# Patient Record
Sex: Male | Born: 1995 | Race: White | Hispanic: No | Marital: Single | Smoking: Never smoker
Health system: Southern US, Community
[De-identification: ages and names within clinical notes are randomized; demographics above are authoritative.]

---
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
## 2014-05-02 NOTE — Op Note (Signed)
PATIENT NAME:  Peter Vasquez, Alfred MR#:  [id number] DATE OF BIRTH:  Mar 02, 1995  DATE OF PROCEDURE:  06/05/2011  PREOPERATIVE DIAGNOSIS: Acute appendicitis.   POSTOPERATIVE DIAGNOSIS: Acute appendicitis.   OPERATIVE PROCEDURE: Laparoscopic appendectomy.  SURGEON: Donnalee CurryJeffrey Kateline Kinkade, MD  ANESTHESIA: General endotracheal under Dr. Henrene HawkingKephart.   ESTIMATED BLOOD LOSS: 25 mL.   CLINICAL NOTE: This 19 year old male had been well until noon yesterday when he noted diffuse abdominal pain. This was associated with three episodes of vomiting. [place] [facility] [place] and was identified with appendicitis based on CT. His family had been cared for at this institution in the past and he presented here for assessment. He was admitted by Dr. Michela PitcherEly.  DESCRIPTION OF PROCEDURE: With the patient under adequate general endotracheal anesthesia, the abdomen was prepped with ChloraPrep and draped. He had previously received Invanz for antibiotic prophylaxis. The abdomen was clipped of a small amount of hair in the periumbilical area. The skin was then prepped with ChloraPrep. In Trendelenburg position, a Veress needle was placed through a transumbilical incision. The abdomen was insufflated with CO2 at 10 followed by 12 mmHg pressure, after assuring intra-abdominal location with the hanging drop test. After dilating the port with a 10 mm step port, the catheter was found to be below a very wave-like layer of omentum. The port was repositioned and inspection showed no active bleeding from the omentum. A 10 mm step port was placed in the hypogastrium and a 12 mm Xcel port placed in the left lower quadrant. The appendix was noted in the right lower quadrant and was markedly thickened in its distal three-quarter length. No evidence of perforation or purulent drainage. The base of the appendix was cleared and divided with a blue Endo GIA cartridge and a vascular cartridge was placed across the mesentery. The appendix was  placed in an Endo Catch bag which was then delivered through the 12 mm port site. There was noted to be small spots of bleeding, both along the appendiceal stump and the mesoappendix. These were controlled with electrocautery. The abdomen was then irrigated with lactated Ringer's solution. There seemed to be a little more blood evident from the right lower quadrant and there was a small area along the omentum that showed some bleeding. This was treated also with electrocautery. The visualized bowel showed no evidence of injury. The abdomen was reinspected and again good hemostasis noted.   An 0 Vicryl suture was used to close the 12 mm port site, as well as the umbilical port site.   Skin incisions were closed with 4-0 Vicryl subcuticular suture. Benzoin, Steri-Strips, Telfa, and Tegaderm dressing was then applied.   The patient tolerated the procedure well and was taken to the recovery room in stable condition.  ____________________________ Earline MayotteJeffrey W. Ciani Rutten, MD jwb:slb D: 06/05/2011 14:29:42 ET T: 06/05/2011 15:43:07 ET JOB#: [id number]  cc: Earline MayotteJeffrey W. Kamiyah Kindel, MD, <Dictator> Lois HuxleyJames Davis, MD [facility] Pullin Brion AlimentW Skyllar Notarianni MD ELECTRONICALLY SIGNED 06/05/2011 17:34

## 2014-05-02 NOTE — H&P (Signed)
PATIENT NAME:  Peter Vasquez, Peter Vasquez MR#:  [id number] DATE OF BIRTH:  Oct 13, 1995  DATE OF ADMISSION:  06/05/2011  ADMITTING PHYSICIAN: Dr. Michela PitcherEly   CHIEF COMPLAINT: Abdominal pain.   BRIEF HISTORY: Peter Vasquez is a 19 year old boy seen in [facility] evening with a 12 hour history of abdominal pain. The pain was initially midepigastric/periumbilical, increased over the course of the day, localized to the right lower quadrant. He had an episode of nausea and vomiting, mild anorexia. No fever or chills. No bowel function problems. [facility] for further evaluation. White blood cell count was 15,000. His pain resolved while [facility] was [facility] A CT scan was performed because of his persistent right lower quadrant tenderness and elevated white blood cell count. CT scan revealed a dilated appendix with some periappendiceal fluid and some pericolonic fluid consistent with possible early appendicitis. There was not a Careers [facility]geon [place]. The patient's family requested transfer [facility]. The patient's brother had previously undergone appendectomy performed by Dr. Donnalee CurryJeffrey Byrnett. The family requested [place] [facility] in an effort to have Dr. Lemar LivingsByrnett involved in their son's care. The Emergency Room physician contacted and we arranged for transfer to our hospital.   The patient denies any previous GI problems other than a bowel impaction in the past. He has not had any hepatitis, yellow jaundice, pancreatitis, peptic ulcer disease or gallbladder issues. He has had no previous abdominal surgery. 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 He denies any other major medical problems.   MEDICATIONS: He takes no medications regularly.   ALLERGIES: Has no medical allergies.   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   REVIEW OF SYSTEMS: 10 point review of systems is performed and unremarkable.   PHYSICAL EXAMINATION:  GENERAL: He is an alert, comfortable young man watching television when I arrived to see him.   VITAL SIGNS: Blood pressure 108/64, heart rate 88 and regular. He is afebrile.   HEENT: Unremarkable. He has no axillary or cervical adenopathy. He does not have any scleral icterus. His pupils were equally round.   NECK: His trachea is midline.   CHEST: Clear with no adventitious sounds. He has normal pulmonary excursion.   CARDIAC: No murmurs or gallops to my ear. He seems to be in normal sinus rhythm.   ABDOMEN: His abdomen is generally soft with some mild periumbilical tenderness and some mild right lower quadrant tenderness. He has no significant guarding or rebound. He has no suprapubic tenderness, jiggle tenderness or percussion tenderness. He does have active bowel sounds.   EXTREMITIES: Lower extremity exam reveals full range of motion, no deformities.   PSYCHIATRIC: Normal affect, normal orientation.   ASSESSMENT AND PLAN: I have independently reviewed his CT scan. He does appear to have a dilated appendix with some fluid around it. He has a slightly elevated white blood cell count on his outside blood work. His clinical exam does demonstrate some mild right lower quadrant tenderness but he is certainly not having any significant pain at the present time. He is only approximately 20 hours into this episode. The family would like to have Dr. Lemar LivingsByrnett evaluate the patient with regard to possible surgical intervention so will admit this young man to the hospital, rehydrate him, keep him  n.p.o. and have Dr. Lemar Livings see the patient in the early a.m. The family is in agreement.   ____________________________ Carmie End, MD rle:cms D: 06/05/2011 02:48:55 ET T: 06/05/2011 08:49:24 ET  JOB#: [id number] cc: Carmie End, MD,  <Dictator> Earline Mayotte, MD Quentin Ore MD ELECTRONICALLY SIGNED 06/06/2011 4:12

## 2021-03-21 ENCOUNTER — Encounter: Payer: Self-pay | Admitting: Allergy & Immunology

## 2021-03-21 ENCOUNTER — Other Ambulatory Visit: Payer: Self-pay

## 2021-03-21 ENCOUNTER — Ambulatory Visit (INDEPENDENT_AMBULATORY_CARE_PROVIDER_SITE_OTHER): Payer: No Typology Code available for payment source | Admitting: Allergy & Immunology

## 2021-03-21 VITALS — BP 130/80 | HR 93 | Temp 98.2°F | Resp 16 | Ht 71.0 in | Wt 197.1 lb

## 2021-03-21 DIAGNOSIS — L2381 Allergic contact dermatitis due to animal (cat) (dog) dander: Secondary | ICD-10-CM

## 2021-03-21 DIAGNOSIS — J302 Other seasonal allergic rhinitis: Secondary | ICD-10-CM

## 2021-03-21 NOTE — Progress Notes (Signed)
NEW PATIENT  Date of Service/Encounter:  03/22/21  Consult requested by: Pcp, No   Assessment:   Seasonal and perennial allergic rhinitis (grasses, ragweed, weeds, trees, indoor molds, dust mites, cat, dog, and cockroach)  Allergic dermatitis  Previously in the [employer]   It is really rather surprising that he had some any sensitizations.  I am not sure whether this is related to his skin issues, but it certainly worth addressing with an antihistamine.  I did recommend doing any nasal sprays since he was denying any nasal symptoms.  Allergy shots could be helpful, but of course this is a long-term commitment.  We will just see how he responds to the Zyrtec and see him back in 6 weeks.  We could also consider doing patch testing as well in case there is something else going on that is flaring his skin.  Plan/Recommendations:   1. Seasonal and perennial allergic rhinitis - Testing today showed: grasses, ragweed, weeds, trees, indoor molds, dust mites, cat, dog, and cockroach. - Copy of test results provided.  - Avoidance measures provided. - This might be why your skin is so flared up. - I would definitely get a HEPA filter for your bedroom to decrease the dog dander load in your room. - Obviously we aren ot kicking the dog out completely!  - Starting taking: Zyrtec (cetirizine) 10mg  tablet TWICE daily - You can use an extra dose of the antihistamine, if needed, for breakthrough symptoms.  - Consider nasal saline rinses 1-2 times daily to remove allergens from the nasal cavities as well as help with mucous clearance (this is especially helpful to do before the nasal sprays are given) - Consider allergy shots as a means of long-term control. - Allergy shots "re-train" and "reset" the immune system to ignore environmental allergens and decrease the resulting immune response to those allergens (sneezing, itchy watery eyes, runny nose, nasal congestion, etc).    - Allergy shots improve  symptoms in 75-85% of patients.  - We can discuss more at the next appointment if the medications are not working for you.   2. Allergic contact dermatitis - Avoidance measures of your allergens provided. - Be sure to wash after being outside. - The cetirizine might be able to help prevent the rash as well.  3. Return in about 6 weeks (around 05/02/2021).    This note in its entirety was forwarded to the Provider who requested this consultation.  Subjective:   Peter Vasquez is a 26 y.o. male presenting today for evaluation of  Chief Complaint  Patient presents with   Establish Care    Hasaan B Venturo has a history of the following: Patient Active Problem List   Diagnosis Date Noted   Seasonal and perennial allergic rhinitis 03/22/2021   Allergic contact dermatitis due to animal dander 03/22/2021    History obtained from: chart review and patient.  Peter Vasquez was referred by Pcp, No.     Peter Vasquez is a 26 y.o. male presenting for an evaluation of a persistent rash . 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    Allergic Rhinitis Symptom History: He has a  history of environmental allergy symptoms during the summer time. This is the worse time of the year for him. He was on Claritin but he does not take it yeatr round. He does a lot of outdoor activities.   Food Allergy Symptom History: He eats everything without an issue. No food tends  to make this worse at all.   Skin Symptom History: He has a history of a rash. The first time that it happened was in 2018. It lasted for one year at that point. It would get better and then get worse etc. Around one year ago, last April, it went through its course and it is just now gone.  It started right after deployment and occurred when was in the [place] in [place] [facility] It went through the first cycle in [facility] and nothing seemed to make it worse or better. He got a variety of creams from the [place] and nothing seemed to work. He was  diagnosed with eczema at one point when it was at its worse. He has had no biopsy performed. He has bene to 4-5 different dermatologist. This was the Chief at the [facility]. He was diagnosed with folliculitis at some point. He had a body wash prescription that made it worse.   This rash is very itchy and he is not using anything systemically to control the itching.  He does report that being in contact with animal dander can make the skin quite itchy.  He also notices that when he is outdoors and doing yard work, his skin can become more inflamed.  Otherwise, there is no history of other atopic diseases, including asthma, food allergies, drug allergies, stinging insect allergies, or contact dermatitis. There is no significant infectious history. Vaccinations are up to date.    Past Medical History: Patient Active Problem List   Diagnosis Date Noted   Seasonal and perennial allergic rhinitis 03/22/2021   Allergic contact dermatitis due to animal dander 03/22/2021    Medication List:  Allergies as of 03/21/2021   Not on File      Medication List    as of March 21, 2021 11:59 PM   You have not been prescribed any medications.     Birth History: non-contributory  Developmental History: non-contributory  Past Surgical History: History reviewed. No pertinent surgical history.   Family History: History reviewed. No pertinent family history.   Social History: Veryl lives at home with his family.  He lives in a house that is 27 years old.  There is wood throughout the home.  They have a heat pump for heating and =central cooling.  There is 1 dog inside of the home, who does sleep with him in the bed.  There are no dust mite covers on the bedding.  He is currently a vapor and has vaped for the past 6 months.  Prior to that, he was a smoker.  He does not use a HEPA filter.  There is no fume, chemical, or dust exposure.   Review of Systems  Constitutional: Negative.   Negative for chills, fever, malaise/fatigue and weight loss.  HENT: Negative.  Negative for congestion, ear discharge, ear pain and nosebleeds.   Eyes:  Negative for pain, discharge and redness.  Respiratory:  Negative for cough, sputum production, shortness of breath and wheezing.   Cardiovascular: Negative.  Negative for chest pain and palpitations.  Gastrointestinal:  Negative for abdominal pain, constipation, diarrhea, heartburn, nausea and vomiting.  Skin:  Positive for itching and rash.  Neurological:  Negative for dizziness and headaches.  Endo/Heme/Allergies:  Negative for environmental allergies. Does not bruise/bleed easily.      Objective:   Blood pressure 130/80, pulse 93, temperature 98.2 F (36.8 C), resp. rate 16, height 5\' 11"  (1.803 m), weight 197 lb 2  oz (89.4 kg), SpO2 97 %. Body mass index is 27.49 kg/m.     Physical Exam Vitals reviewed.  Constitutional:      Appearance: He is well-developed.  HENT:     Head: Normocephalic and atraumatic.     Right Ear: Tympanic membrane, ear canal and external ear normal. No drainage, swelling or tenderness. Tympanic membrane is not injected, scarred, erythematous, retracted or bulging.     Left Ear: Tympanic membrane, ear canal and external ear normal. No drainage, swelling or tenderness. Tympanic membrane is not injected, scarred, erythematous, retracted or bulging.     Nose: No nasal deformity, septal deviation, mucosal edema or rhinorrhea.     Right Turbinates: Enlarged and swollen.     Left Turbinates: Enlarged and swollen.     Right Sinus: No maxillary sinus tenderness or frontal sinus tenderness.     Left Sinus: No maxillary sinus tenderness or frontal sinus tenderness.     Mouth/Throat:     Mouth: Mucous membranes are not pale and not dry.     Pharynx: Uvula midline.  Eyes:     General: Allergic shiner present.        Right eye: No discharge.        Left eye: No discharge.     Conjunctiva/sclera: Conjunctivae  normal.     Right eye: Right conjunctiva is not injected. No chemosis.    Left eye: Left conjunctiva is not injected. No chemosis.    Pupils: Pupils are equal, round, and reactive to light.  Cardiovascular:     Rate and Rhythm: Normal rate and regular rhythm.     Heart sounds: Normal heart sounds.  Pulmonary:     Effort: Pulmonary effort is normal. No tachypnea, accessory muscle usage or respiratory distress.     Breath sounds: Normal breath sounds. No wheezing, rhonchi or rales.     Comments: Moving air well in all lung fields.  No increased work of breathing. Chest:     Chest wall: No tenderness.  Abdominal:     Tenderness: There is no abdominal tenderness. There is no guarding or rebound.  Lymphadenopathy:     Head:     Right side of head: No submandibular, tonsillar or occipital adenopathy.     Left side of head: No submandibular, tonsillar or occipital adenopathy.     Cervical: No cervical adenopathy.  Skin:    General: Skin is warm.     Capillary Refill: Capillary refill takes less than 2 seconds.     Coloration: Skin is not pale.     Findings: No abrasion, erythema, petechiae or rash. Rash is not papular, urticarial or vesicular.     Comments: Multiple tattoos. He does have some macular lesions on his back.  There are some excoriations present.  No urticaria.  Neurological:     Mental Status: He is alert.     Diagnostic studies:    Allergy Studies:     Airborne Adult Perc - 03/21/21 1036     Time Antigen Placed 1015    Allergen Manufacturer Waynette Buttery    Location Back    Number of Test 59    1. Control-Buffer 50% Glycerol Negative    2. Control-Histamine 1 mg/ml 2+    3. Albumin saline Negative    4. Bahia 4+    5. French Southern [place] 4+    6. Johnson 4+    7. Kentucky Blue 4+    8. Meadow Fescue 4+    9. Perennial Rye 4+  10. Sweet Vernal 4+    11. Timothy 4+    12. Cocklebur 4+    13. Burweed Marshelder 2+    14. Ragweed, short 2+    15. Ragweed, Giant 4+    16.  Plantain,  English 2+    17. Lamb's Quarters 2+    18. Sheep Sorrell 2+    19. Rough Pigweed 2+    20. Marsh Elder, Rough 2+    21. Mugwort, Common Negative    22. Ash mix Negative    23. Birch mix Negative    24. Beech American 2+    25. Box, Elder 2+    26. Cedar, red 2+    27. Cottonwood, Guinea-Bissau Negative    28. Elm mix Negative    29. Hickory 3+    30. Maple mix 3+    31. Oak, Guinea-Bissau mix Negative    32. Pecan Pollen 3+    33. Pine mix Negative    34. Sycamore Eastern Negative    35. Walnut, Black Pollen 3+    36. Alternaria alternata Negative    37. Cladosporium Herbarum Negative    38. Aspergillus mix Negative    39. Penicillium mix Negative    40. Bipolaris sorokiniana (Helminthosporium) Negative    41. Drechslera spicifera (Curvularia) Negative    42. Mucor plumbeus Negative    43. Fusarium moniliforme Negative    44. Aureobasidium pullulans (pullulara) Negative    45. Rhizopus oryzae Negative    46. Botrytis cinera Negative    47. Epicoccum nigrum Negative    48. Phoma betae Negative    49. Candida Albicans Negative    50. Trichophyton mentagrophytes Negative    51. Mite, D Farinae  5,000 AU/ml 3+    52. Mite, D Pteronyssinus  5,000 AU/ml 3+    53. Cat Hair 10,000 BAU/ml 3+    54.  Dog Epithelia Negative    55. Mixed Feathers Negative    56. Horse Epithelia Negative    57. Cockroach, German Negative    58. Mouse Negative    59. Tobacco Leaf Negative             Food Perc - 03/21/21 1036       Test Information   Time Antigen Placed 1015    Allergen Manufacturer Waynette Buttery    Location Back    Number of allergen test 10      Food   1. Peanut Negative    2. Soybean food Negative    3. Wheat, whole Negative    4. Sesame Negative    5. Milk, cow Negative    6. Egg White, chicken Negative    7. Casein Negative    8. Shellfish mix Negative    9. Fish mix Negative    10. Cashew Negative             Intradermal - 03/21/21 1036     Time Antigen  Placed 1045    Allergen Manufacturer Greer    Location Arm    Number of Test 7    Intradermal Select    Control Negative    Tree mix Omitted    Mold 1 Negative    Mold 2 3+    Mold 3 Negative    Mold 4 Negative    Dog 4+    Cockroach 4+             Allergy testing results were read and interpreted by myself, documented by  clinical staff.         Malachi Bonds, MD [facility]

## 2021-03-21 NOTE — Patient Instructions (Addendum)
1. Seasonal and perennial allergic rhinitis ?- Testing today showed: grasses, ragweed, weeds, trees, indoor molds, dust mites, cat, dog, and cockroach. ?- Copy of test results provided.  ?- Avoidance measures provided. ?- This might be why your skin is so flared up. ?- I would definitely get a HEPA filter for your bedroom to decrease the dog dander load in your room. ?- Obviously we aren ot kicking the dog out completely!  ?- Starting taking: Zyrtec (cetirizine) 10mg  tablet TWICE daily ?- You can use an extra dose of the antihistamine, if needed, for breakthrough symptoms.  ?- Consider nasal saline rinses 1-2 times daily to remove allergens from the nasal cavities as well as help with mucous clearance (this is especially helpful to do before the nasal sprays are given) ?- Consider allergy shots as a means of long-term control. ?- Allergy shots "re-train" and "reset" the immune system to ignore environmental allergens and decrease the resulting immune response to those allergens (sneezing, itchy watery eyes, runny nose, nasal congestion, etc).    ?- Allergy shots improve symptoms in 75-85% of patients.  ?- We can discuss more at the next appointment if the medications are not working for you.  ? ?2. Allergic contact dermatitis ?- Avoidance measures of your allergens provided. ?- Be sure to wash after being outside. ?- The cetirizine might be able to help prevent the rash as well. ? ?3. Return in about 6 weeks (around 05/02/2021).  ? ? ?Please inform us of any Emergency Department visits, hospitalizations, or changes in symptoms. Call us before going to the ED for breathing or allergy symptoms since we might be able to fit you in for a sick visit. Feel free to contact us anytime with any questions, problems, or concerns. ? ?It was a pleasure to meet you today! ? ?Websites that have reliable patient information: ?1. American Academy of Asthma, Allergy, and Immunology: www.aaaai.org ?2. Food Allergy Research and  Education (FARE): foodallergy.org ?3. Mothers of Asthmatics: http://www.asthmacommunitynetwork.org ?4. [employer] of Allergy, Asthma, and Immunology: [email or web address].cawww.acaai.org ? ? ?COVID-19 Vaccine Information can be found at: [email or web address] For questions related to vaccine distribution or appointments, please email [email or web address] or call [phone].  ? ?We realize that you might be concerned about having an allergic reaction to the COVID19 vaccines. To help with that concern, WE ARE OFFERING THE COVID19 VACCINES IN OUR OFFICE! Ask the front desk for dates!  ? ? ? ??Like? us on Facebook and Instagram for our latest updates!  ?  ? ? ?A healthy democracy works best when [employer] voters participate! Make sure you are registered to vote! If you have moved or changed any of your contact information, you will need to get this updated before voting! ? ?In some cases, you MAY be able to register to vote online: [email or web address] ? ? ? ? Airborne Adult Perc - 03/21/21 1036   ? ? Time Antigen Placed 1015   ? Allergen Manufacturer Waynette ButteryGreer   ? Location Back   ? Number of Test 59   ? 1. Control-Buffer 50% Glycerol Negative   ? 2. Control-Histamine 1 mg/ml 2+   ? 3. Albumin saline Negative   ? 4. [place] 4+   ? 5. [place] 4+   ? 6. Johnson 4+   ? 7. [place] Blue 4+   ? 8. Meadow Fescue 4+   ? 9. Perennial Rye 4+   ? 10. Sweet Vernal 4+   ? 11. Timothy 4+   ? 12. Cocklebur 4+   ?  13. Burweed Marshelder 2+   ? 14. Ragweed, short 2+   ? 15. Ragweed, Giant 4+   ? 16. Plantain,  English 2+   ? 17. Lamb's Quarters 2+   ? 18. Sheep Sorrell 2+   ? 19. Rough Pigweed 2+   ? 20. Marsh Elder, Rough 2+   ? 21. Mugwort, Common Negative   ? 22. Ash mix Negative   ? 23. Charletta Cousin mix Negative   ? 24. Beech American 2+   ? 25. Box, Elder 2+   ? 26. Cedar, red 2+   ? 27. Cottonwood, [place] Negative   ? 28. Elm mix Negative   ? 29. Hickory 3+   ? 30. Maple mix 3+    ? 31. Oak, [place] mix Negative   ? 32. Pecan Pollen 3+   ? 33. Pine mix Negative   ? 34. Sycamore Eastern Negative   ? 35. Walnut, Black Pollen 3+   ? 36. Alternaria alternata Negative   ? 37. Cladosporium Herbarum Negative   ? 38. Aspergillus mix Negative   ? 39. Penicillium mix Negative   ? 40. Bipolaris sorokiniana (Helminthosporium) Negative   ? 41. Drechslera spicifera (Curvularia) Negative   ? 42. Mucor plumbeus Negative   ? 43. Fusarium moniliforme Negative   ? 44. Aureobasidium pullulans (pullulara) Negative   ? 45. Rhizopus oryzae Negative   ? 46. Botrytis cinera Negative   ? 47. Epicoccum nigrum Negative   ? 48. Phoma betae Negative   ? 49. Candida Albicans Negative   ? 50. Trichophyton mentagrophytes Negative   ? 51. Mite, D Farinae  5,000 AU/ml 3+   ? 52. Mite, D Pteronyssinus  5,000 AU/ml 3+   ? 53. Cat Hair 10,000 BAU/ml 3+   ? 54.  Dog Epithelia Negative   ? 55. Mixed Feathers Negative   ? 56. Horse Epithelia Negative   ? 57. Cockroach, [place] Negative   ? 58. Mouse Negative   ? 59. Tobacco Leaf Negative   ? ?  ?  ? ?  ? ? Food Perc - 03/21/21 1036   ? ?  ? Test Information  ? Time Antigen Placed 1015   ? Allergen Manufacturer Waynette Buttery   ? Location Back   ? Number of allergen test 10   ?  ? Food  ? 1. Peanut Negative   ? 2. Soybean food Negative   ? 3. Wheat, whole Negative   ? 4. Sesame Negative   ? 5. Milk, cow Negative   ? 6. Egg White, chicken Negative   ? 7. Casein Negative   ? 8. Shellfish mix Negative   ? 9. Fish mix Negative   ? 10. Cashew Negative   ? ?  ?  ? ?  ? ? Intradermal - 03/21/21 1036   ? ? Time Antigen Placed 1045   ? Allergen Manufacturer Waynette Buttery   ? Location Arm   ? Number of Test 7   ? Intradermal Select   ? Control Negative   ? Tree mix Omitted   ? Mold 1 Negative   ? Mold 2 3+   ? Mold 3 Negative   ? Mold 4 Negative   ? Dog 4+   ? Cockroach 4+   ? ?  ?  ? ?  ? ? ?Reducing Pollen Exposure ? ?The American Academy of Allergy, Asthma and Immunology suggests the following steps to  reduce your exposure to pollen during allergy seasons. ?   ?Do not  hang sheets or clothing out to dry; pollen may collect on these items. ?Do not mow lawns or spend time around freshly cut grass; mowing stirs up pollen. ?Keep windows closed at night.  Keep car windows closed while driving. ?Minimize morning activities outdoors, a time when pollen counts are usually at their highest. ?Stay indoors as much as possible when pollen counts or humidity is high and on windy days when pollen tends to remain in the air longer. ?Use air conditioning when possible.  Many air conditioners have filters that trap the pollen spores. ?Use a HEPA room air filter to remove pollen form the indoor air you breathe. ? ?Control of Mold Allergen  ? ?Mold and fungi can grow on a variety of surfaces provided certain temperature and moisture conditions exist.  Outdoor molds grow on plants, decaying vegetation and soil.  The major outdoor mold, Alternaria and Cladosporium, are found in very high numbers during hot and dry conditions.  Generally, a late Summer - Fall peak is seen for common outdoor fungal spores.  Rain will temporarily lower outdoor mold spore count, but counts rise rapidly when the rainy period ends.  The most important indoor molds are Aspergillus and Penicillium.  Dark, humid and poorly ventilated basements are ideal sites for mold growth.  The next most common sites of mold growth are the bathroom and the kitchen. ? ?Indoor (Perennial) Mold Control  ? ?Positive indoor molds via skin testing: Aspergillus and Penicillium ? ?Maintain humidity below 50%. ?Clean washable surfaces with 5% bleach solution. ?Remove sources e.g. contaminated carpets. ? ? ? ?Control of Dust Mite Allergen ? ? ? ?Dust mites play a major role in allergic asthma and rhinitis.  They occur in environments with high humidity wherever human skin is found.  Dust mites absorb humidity from the atmosphere (ie, they do not drink) and feed on organic matter  (including shed human and animal skin).  Dust mites are a microscopic type of insect that you cannot see with the naked eye.  High levels of dust mites have been detected from mattresses, pillows, carpets, upholster

## 2021-03-22 DIAGNOSIS — J302 Other seasonal allergic rhinitis: Secondary | ICD-10-CM | POA: Insufficient documentation

## 2021-03-22 DIAGNOSIS — L2381 Allergic contact dermatitis due to animal (cat) (dog) dander: Secondary | ICD-10-CM | POA: Insufficient documentation

## 2021-05-30 ENCOUNTER — Ambulatory Visit: Payer: No Typology Code available for payment source | Admitting: Allergy & Immunology

## 2021-05-30 ENCOUNTER — Telehealth: Payer: Self-pay | Admitting: Allergy & Immunology

## 2021-05-30 NOTE — Telephone Encounter (Signed)
I called the patient to make sure he was okay since he missed his appointment.  He said that he push the reschedule button rather than the cancel button when he got the automated voicemail.  He wants to make sure that it is being clear that he did not miss the appointment.  He is worried that the [place] will not allow him to schedule any more appointments if he is listed as missing appointments.  He also wonders whether he needs to come back at all.  I did tell him I was thinking about initiating Dupixent at this next visit as a last ditch effort to see what might be going on.  He was recently placed on doxycycline by his dermatologist since it was felt that he had folliculitis, but he has not seen much improvement.  He will look up [place] and call this week to reschedule his appointment.  Salvatore Marvel, MD [facility] [place] [facility] [place]

## 2021-05-31 ENCOUNTER — Telehealth: Payer: Self-pay | Admitting: Allergy & Immunology

## 2021-05-31 NOTE — Telephone Encounter (Signed)
Entered in error
